# Patient Record
Sex: Female | Born: 1990 | Race: Black or African American | Hispanic: No | Marital: Single | Smoking: Never smoker
Health system: Southern US, Community
[De-identification: ages and names within clinical notes are randomized; demographics above are authoritative.]

## PROBLEM LIST (undated history)

## (undated) ENCOUNTER — Inpatient Hospital Stay (HOSPITAL_COMMUNITY): Payer: Self-pay

## (undated) DIAGNOSIS — R87629 Unspecified abnormal cytological findings in specimens from vagina: Secondary | ICD-10-CM

## (undated) DIAGNOSIS — K649 Unspecified hemorrhoids: Secondary | ICD-10-CM

## (undated) DIAGNOSIS — N83209 Unspecified ovarian cyst, unspecified side: Secondary | ICD-10-CM

## (undated) HISTORY — PX: CRYOTHERAPY: SHX1416

## (undated) HISTORY — PX: WISDOM TOOTH EXTRACTION: SHX21

---

## 2015-02-18 ENCOUNTER — Encounter (HOSPITAL_COMMUNITY): Payer: Self-pay | Admitting: Emergency Medicine

## 2015-02-18 DIAGNOSIS — Z3202 Encounter for pregnancy test, result negative: Secondary | ICD-10-CM | POA: Insufficient documentation

## 2015-02-18 DIAGNOSIS — R102 Pelvic and perineal pain: Secondary | ICD-10-CM | POA: Diagnosis present

## 2015-02-18 DIAGNOSIS — N8320 Unspecified ovarian cysts: Secondary | ICD-10-CM | POA: Insufficient documentation

## 2015-02-18 LAB — URINALYSIS, ROUTINE W REFLEX MICROSCOPIC
Bilirubin Urine: NEGATIVE
Glucose, UA: NEGATIVE mg/dL
Hgb urine dipstick: NEGATIVE
KETONES UR: NEGATIVE mg/dL
LEUKOCYTES UA: NEGATIVE
NITRITE: NEGATIVE
PROTEIN: NEGATIVE mg/dL
Specific Gravity, Urine: 1.021 (ref 1.005–1.030)
Urobilinogen, UA: 0.2 mg/dL (ref 0.0–1.0)
pH: 8 (ref 5.0–8.0)

## 2015-02-18 LAB — COMPREHENSIVE METABOLIC PANEL
ALT: 8 U/L — AB (ref 14–54)
AST: 14 U/L — ABNORMAL LOW (ref 15–41)
Albumin: 4 g/dL (ref 3.5–5.0)
Alkaline Phosphatase: 49 U/L (ref 38–126)
Anion gap: 7 (ref 5–15)
BUN: 7 mg/dL (ref 6–20)
CALCIUM: 8.9 mg/dL (ref 8.9–10.3)
CHLORIDE: 105 mmol/L (ref 101–111)
CO2: 23 mmol/L (ref 22–32)
CREATININE: 0.65 mg/dL (ref 0.44–1.00)
Glucose, Bld: 93 mg/dL (ref 65–99)
Potassium: 3.9 mmol/L (ref 3.5–5.1)
Sodium: 135 mmol/L (ref 135–145)
Total Bilirubin: 0.3 mg/dL (ref 0.3–1.2)
Total Protein: 6.9 g/dL (ref 6.5–8.1)

## 2015-02-18 LAB — CBC
HCT: 35.8 % — ABNORMAL LOW (ref 36.0–46.0)
Hemoglobin: 12.4 g/dL (ref 12.0–15.0)
MCH: 30.9 pg (ref 26.0–34.0)
MCHC: 34.6 g/dL (ref 30.0–36.0)
MCV: 89.3 fL (ref 78.0–100.0)
PLATELETS: 246 10*3/uL (ref 150–400)
RBC: 4.01 MIL/uL (ref 3.87–5.11)
RDW: 12.7 % (ref 11.5–15.5)
WBC: 5.7 10*3/uL (ref 4.0–10.5)

## 2015-02-18 LAB — LIPASE, BLOOD: LIPASE: 28 U/L (ref 22–51)

## 2015-02-18 NOTE — ED Notes (Signed)
Patient here with complaint of left lower abdominal/pelvic pain. States onset 1 week ago. Denies all other symptoms. Pain is dull and becomes sharp at times. Stopped Depo injections in July after having been taking them since age 24.

## 2015-02-19 ENCOUNTER — Emergency Department (HOSPITAL_COMMUNITY)
Admission: EM | Admit: 2015-02-19 | Discharge: 2015-02-19 | Disposition: A | Discharge status: Home / Self Care | Payer: Managed Care, Other (non HMO) | Attending: Emergency Medicine | Admitting: Emergency Medicine

## 2015-02-19 ENCOUNTER — Emergency Department (HOSPITAL_COMMUNITY): Payer: Managed Care, Other (non HMO)

## 2015-02-19 DIAGNOSIS — R1032 Left lower quadrant pain: Secondary | ICD-10-CM

## 2015-02-19 DIAGNOSIS — N83201 Unspecified ovarian cyst, right side: Secondary | ICD-10-CM

## 2015-02-19 LAB — PREGNANCY, URINE: Preg Test, Ur: NEGATIVE

## 2015-02-19 LAB — WET PREP, GENITAL
Clue Cells Wet Prep HPF POC: NONE SEEN
TRICH WET PREP: NONE SEEN
YEAST WET PREP: NONE SEEN

## 2015-02-19 LAB — GC/CHLAMYDIA PROBE AMP (~~LOC~~) NOT AT ARMC
CHLAMYDIA, DNA PROBE: NEGATIVE
Neisseria Gonorrhea: NEGATIVE

## 2015-02-19 LAB — HIV ANTIBODY (ROUTINE TESTING W REFLEX): HIV Screen 4th Generation wRfx: NONREACTIVE

## 2015-02-19 NOTE — ED Provider Notes (Signed)
CSN: [id number]     Arrival date & time 02/18/15  2035 History  This chart was scribed for Tomasita Crumble, MD by Evon Slack, ED Scribe. This patient was seen in room A10C/A10C and the patient's care was started at 1:25 AM.     Chief Complaint  Patient presents with  . Abdominal Pain   The history is provided by the patient. No language interpreter was used.   HPI Comments: Lisa Summers is a 24 y.o. female who presents to the Emergency Department complaining of lower pelvic pain onset 1 week prior. She states that the pain is intermittently worse and becomes stabbing. She states that the pain intermittently radiates to her back. She states that in certain position the pain radiates up her left side. She reports some left breast tenderness as well. She states that she has been taking ibuprofen with no relief. Pt states that she initially thought her menstraul cycle was starting due to coming off her depo injection. She states that her last depo injection was April 2016.  Pt denies dysuria, vaginal bleeding or other related symptoms.   History reviewed. No pertinent past medical history. Past Surgical History  Procedure Laterality Date  . Wisdom tooth extraction     No family history on file. Social History  Substance Use Topics  . Smoking status: Never Smoker   . Smokeless tobacco: None  . Alcohol Use: Yes     Comment: occasional   OB History    No data available      Review of Systems  Gastrointestinal: Positive for abdominal pain.  Genitourinary: Negative for dysuria and vaginal bleeding.  All other systems reviewed and are negative.    Allergies  Review of patient's allergies indicates no known allergies.  Home Medications   Prior to Admission medications   Medication Sig Start Date End Date Taking? Authorizing Provider  ibuprofen (ADVIL,MOTRIN) 200 MG tablet Take 600 mg by mouth every 6 (six) hours as needed for mild pain.   Yes Historical Provider, MD   naproxen sodium (ANAPROX) 220 MG tablet Take 440 mg by mouth daily as needed (general pain).   Yes Historical Provider, MD   BP 98/60 mmHg  Pulse 98  Temp(Src) 98.2 F (36.8 C) (Oral)  Resp 16  Ht  (1.626 m)  Wt 105 lb (47.628 kg)  BMI 18.01 kg/m2  SpO2 98%  LMP    Physical Exam  Constitutional: She is oriented to person, place, and time. She appears well-developed and well-nourished. No distress.  HENT:  Head: Normocephalic and atraumatic.  Nose: Nose normal.  Mouth/Throat: Oropharynx is clear and moist. No oropharyngeal exudate.  Eyes: Conjunctivae and EOM are normal. Pupils are equal, round, and reactive to light. No scleral icterus.  Neck: Normal range of motion. Neck supple. No JVD present. No tracheal deviation present. No thyromegaly present.  Cardiovascular: Normal rate, regular rhythm and normal heart sounds.  Exam reveals no gallop and no friction rub.   No murmur heard. Pulmonary/Chest: Effort normal and breath sounds normal. No respiratory distress. She has no wheezes. She exhibits no tenderness.  Abdominal: Soft. Bowel sounds are normal. She exhibits no distension and no mass. There is tenderness in the left lower quadrant. There is no rebound and no guarding.  Genitourinary: Vagina normal and uterus normal. No vaginal discharge found.  No CMT or adnexal tenderness  Musculoskeletal: Normal range of motion. She exhibits no edema or tenderness.  Lymphadenopathy:    She has no cervical adenopathy.  Neurological: She is alert and oriented to person, place, and time. No cranial nerve deficit. She exhibits normal muscle tone.  Skin: Skin is warm and dry. No rash noted. No erythema. No pallor.  Nursing note and vitals reviewed.   ED Course  Procedures (including critical care time) DIAGNOSTIC STUDIES: Oxygen Saturation is 98% on RA, normal by my interpretation.    COORDINATION OF CARE: 2:03 AM-Discussed treatment plan with pt at bedside and pt agreed to plan.      Labs Review Labs Reviewed  WET PREP, GENITAL - Abnormal; Notable for the following:    WBC, Wet Prep HPF POC FEW (*)    All other components within normal limits  COMPREHENSIVE METABOLIC PANEL - Abnormal; Notable for the following:    AST 14 (*)    ALT 8 (*)    All other components within normal limits  CBC - Abnormal; Notable for the following:    HCT 35.8 (*)    All other components within normal limits  URINALYSIS, ROUTINE W REFLEX MICROSCOPIC (NOT AT [facility]) - Abnormal; Notable for the following:    APPearance CLOUDY (*)    All other components within normal limits  LIPASE, BLOOD  PREGNANCY, URINE  HIV ANTIBODY (ROUTINE TESTING)  GC/CHLAMYDIA PROBE AMP ([place]) NOT AT [facility]    Imaging Review US Transvaginal Non-ob  02/19/2015   CLINICAL DATA:  Left lower quadrant pain for 1 week.  EXAM: TRANSABDOMINAL AND TRANSVAGINAL ULTRASOUND OF PELVIS  TECHNIQUE: Both transabdominal and transvaginal ultrasound examinations of the pelvis were performed. Transabdominal technique was performed for global imaging of the pelvis including uterus, ovaries, adnexal regions, and pelvic cul-de-sac. It was necessary to proceed with endovaginal exam following the transabdominal exam to visualize the right and left ovary.  COMPARISON:  None  FINDINGS: Uterus  Measurements: 5.6 x 3.6 x 4.9 cm. No fibroids or other mass visualized.  Endometrium  Thickness: 3 mm.  No focal abnormality visualized.  Right ovary  Measurements: 4.7 x 3.2 x 4.2 cm. There is a 3.4 x 2.5 x 3.0 cm simple cyst in the right ovary. Blood flow seen to the surrounding ovarian parenchyma.  Left ovary  Measurements: 3.6 x 1.9 x 2.9 cm. There are physiologic follicles. Normal appearance/no adnexal mass. Normal blood flow seen.  Other findings  No free fluid.  IMPRESSION: 1. Simple right ovarian cyst measuring 3.4 cm. Normal blood flow to the surrounding ovarian parenchyma. 2. Normal appearance of the uterus and left ovary.   Electronically  Signed   By: Rubye Oaks M.D.   On: 02/19/2015 04:55   US Pelvis Complete  02/19/2015   CLINICAL DATA:  Left lower quadrant pain for 1 week.  EXAM: TRANSABDOMINAL AND TRANSVAGINAL ULTRASOUND OF PELVIS  TECHNIQUE: Both transabdominal and transvaginal ultrasound examinations of the pelvis were performed. Transabdominal technique was performed for global imaging of the pelvis including uterus, ovaries, adnexal regions, and pelvic cul-de-sac. It was necessary to proceed with endovaginal exam following the transabdominal exam to visualize the right and left ovary.  COMPARISON:  None  FINDINGS: Uterus  Measurements: 5.6 x 3.6 x 4.9 cm. No fibroids or other mass visualized.  Endometrium  Thickness: 3 mm.  No focal abnormality visualized.  Right ovary  Measurements: 4.7 x 3.2 x 4.2 cm. There is a 3.4 x 2.5 x 3.0 cm simple cyst in the right ovary. Blood flow seen to the surrounding ovarian parenchyma.  Left ovary  Measurements: 3.6 x 1.9 x 2.9 cm. There are physiologic  follicles. Normal appearance/no adnexal mass. Normal blood flow seen.  Other findings  No free fluid.  IMPRESSION: 1. Simple right ovarian cyst measuring 3.4 cm. Normal blood flow to the surrounding ovarian parenchyma. 2. Normal appearance of the uterus and left ovary.   Electronically Signed   By: Rubye Oaks M.D.   On: 02/19/2015 04:55      EKG Interpretation None      MDM   Final diagnoses:  None   Patient presents to the ED for LLQ abdominal pain x 1 week.  Labs and pelvic exam are unremarkable.  Will obtain transvaginal ultrasound for eval of torsion vs. Fibroids.  She is not requesting any pain meds.  She appears quite well and smiling in the room  Pelvic exam is unremarkable. Ultrasound reveals a simple cyst on the right side. Her pain seems to be mostly on the left side although she does admit to bilateral lower quadrant pain. Ovarian cyst was described to the patient and her mother, she was explained that this may not be  causing all of her pain. OB/GYN follow-up was advised. Return precautions given for possible CT scan if symptoms persist. She otherwise appears well, she is sleeping comfortably in the room and in no acute distress. Her vital signs remain within her normal limits and she is safe for discharge.   I personally performed the services described in this documentation, which was scribed in my presence. The recorded information has been reviewed and is accurate.    Tomasita Crumble, MD 02/19/15 [phone]

## 2015-02-19 NOTE — Discharge Instructions (Signed)
Ovarian Cyst Ms. Morrill, your ultrasound shows an ovarian cyst on the right side. If the results are below. This may be causing your pain, use Tylenol or ibuprofen as needed. See an OB/GYN within 3 days for close follow-up. If your pain worsens or becomes more severe, complex emergency department immediately. Thank you.   IMPRESSION: 1. Simple right ovarian cyst measuring 3.4 cm. Normal blood flow to the surrounding ovarian parenchyma. 2. Normal appearance of the uterus and left ovary.  An ovarian cyst is a sac filled with fluid or blood. This sac is attached to the ovary. Some cysts go away on their own. Other cysts need treatment.  HOME CARE   Only take medicine as told by your doctor.  Follow up with your doctor as told.  Get regular pelvic exams and Pap tests. GET HELP IF:  Your periods are late, not regular, or painful.  You stop having periods.  Your belly (abdominal) or pelvic pain does not go away.  Your belly becomes large or puffy (swollen).  You have a hard time peeing (totally emptying your bladder).  You have pressure on your bladder.  You have pain during sex.  You feel fullness, pressure, or discomfort in your belly.  You lose weight for no reason.  You feel sick most of the time.  You have a hard time pooping (constipation).  You do not feel like eating.  You develop pimples (acne).  You have an increase in hair on your body and face.  You are gaining weight for no reason.  You think you are pregnant. GET HELP RIGHT AWAY IF:   Your belly pain gets worse.  You feel sick to your stomach (nauseous), and you throw up (vomit).  You have a fever that comes on fast.  You have belly pain while pooping (bowel movement).  Your periods are heavier than usual. MAKE SURE YOU:   Understand these instructions.  Will watch your condition.  Will get help right away if you are not doing well or get worse. Document Released: 11/11/2007 Document Revised:  03/15/2013 Document Reviewed: 01/30/2013 [facility] Patient Information 2015 [place], [place]. This information is not intended to replace advice given to you by your health care provider. Make sure you discuss any questions you have with your health care provider.

## 2016-07-17 ENCOUNTER — Other Ambulatory Visit (HOSPITAL_COMMUNITY): Payer: Self-pay | Admitting: Specialist

## 2016-07-17 DIAGNOSIS — Z8279 Family history of other congenital malformations, deformations and chromosomal abnormalities: Secondary | ICD-10-CM

## 2016-08-05 DIAGNOSIS — Z8759 Personal history of other complications of pregnancy, childbirth and the puerperium: Secondary | ICD-10-CM

## 2016-08-05 DIAGNOSIS — O4590 Premature separation of placenta, unspecified, unspecified trimester: Secondary | ICD-10-CM

## 2016-08-10 ENCOUNTER — Encounter (HOSPITAL_COMMUNITY): Payer: Self-pay | Admitting: Specialist

## 2016-08-18 ENCOUNTER — Encounter (HOSPITAL_COMMUNITY): Payer: Self-pay

## 2016-08-18 ENCOUNTER — Ambulatory Visit (HOSPITAL_COMMUNITY): Payer: Managed Care, Other (non HMO)

## 2017-01-25 ENCOUNTER — Ambulatory Visit: Payer: Managed Care, Other (non HMO)

## 2017-12-13 ENCOUNTER — Encounter: Payer: Managed Care, Other (non HMO) | Admitting: Family Medicine

## 2017-12-17 LAB — OB RESULTS CONSOLE HIV ANTIBODY (ROUTINE TESTING): HIV: NONREACTIVE

## 2017-12-29 ENCOUNTER — Other Ambulatory Visit (HOSPITAL_COMMUNITY): Payer: Self-pay | Admitting: Obstetrics and Gynecology

## 2017-12-29 DIAGNOSIS — O09299 Supervision of pregnancy with other poor reproductive or obstetric history, unspecified trimester: Secondary | ICD-10-CM

## 2018-01-05 ENCOUNTER — Encounter (HOSPITAL_COMMUNITY): Payer: Self-pay | Admitting: *Deleted

## 2018-01-09 ENCOUNTER — Inpatient Hospital Stay (HOSPITAL_COMMUNITY): Payer: 59

## 2018-01-09 ENCOUNTER — Encounter (HOSPITAL_COMMUNITY): Payer: Self-pay

## 2018-01-09 ENCOUNTER — Inpatient Hospital Stay (HOSPITAL_COMMUNITY)
Admission: AD | Admit: 2018-01-09 | Discharge: 2018-01-09 | Disposition: A | Discharge status: Home / Self Care | Payer: 59 | Source: Ambulatory Visit | Attending: Obstetrics & Gynecology | Admitting: Obstetrics & Gynecology

## 2018-01-09 DIAGNOSIS — O209 Hemorrhage in early pregnancy, unspecified: Secondary | ICD-10-CM

## 2018-01-09 DIAGNOSIS — Z3A12 12 weeks gestation of pregnancy: Secondary | ICD-10-CM | POA: Insufficient documentation

## 2018-01-09 NOTE — MAU Provider Note (Signed)
Chief Complaint: Vaginal Bleeding   None    SUBJECTIVE HPI: Lisa Summers is a 27 y.o. G2P0010 at 235w5d who presents to Maternity Admissions reporting leaking of bloody fluid and pelvic pressure.  Past hx of 17 week loss with SROM.  Denies smoking or drug use. US in office at 9.3 days normal US cervical length wnl  Location: uterine cramping and vaginal bleeding Quality: watery bloody fluid Severity: 5/10 on pain scale Duration: tonight Timing: comes and goes Modifying factors: has had no otc   Past Medical History:  Diagnosis Date  . Hemorrhoids   . Ovarian cyst   . Vaginal Pap smear, abnormal    OB History  Gravida Para Term Preterm AB Living  2       1 0  SAB TAB Ectopic Multiple Live Births  1            # Outcome Date GA Lbr Len/2nd Weight Sex Delivery Anes PTL Lv  2 Current           1 SAB 08/05/16 4416w0d            Complications: History of preterm premature rupture of membranes (PPROM), Antepartum placental abruption   Past Surgical History:  Procedure Laterality Date  . CRYOTHERAPY     of cervical lesion  . WISDOM TOOTH EXTRACTION     Social History   Socioeconomic History  . Marital status: Significant Other    Spouse name: Not on file  . Number of children: Not on file  . Years of education: Not on file  . Highest education level: Not on file  Occupational History  . Not on file  Social Needs  . Financial resource strain: Not on file  . Food insecurity:    Worry: Not on file    Inability: Not on file  . Transportation needs:    Medical: Not on file    Non-medical: Not on file  Tobacco Use  . Smoking status: Never Smoker  . Smokeless tobacco: Never Used  Substance and Sexual Activity  . Alcohol use: Yes    Comment: occasional  . Drug use: No  . Sexual activity: Not on file  Lifestyle  . Physical activity:    Days per week: Not on file    Minutes per session: Not on file  . Stress: Not on file  Relationships  . Social  connections:    Talks on phone: Not on file    Gets together: Not on file    Attends religious service: Not on file    Active member of club or organization: Not on file    Attends meetings of clubs or organizations: Not on file    Relationship status: Not on file  . Intimate partner violence:    Fear of current or ex partner: Not on file    Emotionally abused: Not on file    Physically abused: Not on file    Forced sexual activity: Not on file  Other Topics Concern  . Not on file  Social History Narrative  . Not on file   Family History  Problem Relation Age of Onset  . Asthma Mother   . Cancer Father   . Cancer Maternal Aunt   . Heart disease Maternal Grandmother   . Hypertension Maternal Grandmother   . Diabetes Maternal Grandmother    No current facility-administered medications on file prior to encounter.    Current Outpatient Medications on File Prior to Encounter  Medication Sig  Dispense Refill  . ibuprofen (ADVIL,MOTRIN) 200 MG tablet Take 600 mg by mouth every 6 (six) hours as needed for mild pain.    . naproxen sodium (ANAPROX) 220 MG tablet Take 440 mg by mouth daily as needed (general pain).     No Known Allergies  I have reviewed patient's Past Medical Hx, Surgical Hx, Family Hx, Social Hx, medications and allergies.   Review of Systems  Constitutional: Negative.   HENT: Negative.   Eyes: Negative.   Respiratory: Negative.   Cardiovascular: Negative.   Gastrointestinal: Positive for abdominal pain.  Endocrine: Negative.   Genitourinary: Positive for vaginal bleeding.  Musculoskeletal: Negative.   Skin: Negative.   Allergic/Immunologic: Negative.   Neurological: Negative.   Hematological: Negative.   Psychiatric/Behavioral: Negative.     OBJECTIVE Patient Vitals for the past 24 hrs:  BP Temp Pulse Resp  01/09/18 2121 113/68 98.4 F (36.9 C) 91 19  FHT 162 Constitutional: Well-developed, well-nourished female in no acute distress.   Cardiovascular: normal rate Respiratory: normal rate and effort.  GI: Abd soft, slight tenderness, gravid appropriate for gestational age.  MS: Extremities nontender, no edema, normal ROM Neurologic: Alert and oriented x 4.  GU: Neg CVAT.  SPECULUM EXAM: NEFG, physiologic discharge,bloody watery discharge noted, cervix clean and closed.   BIMANUAL: cervix LTC; uterus 12 weeksize, no adnexal tenderness or masses.  No CMT.  LAB RESULTS No results found for this or any previous visit (from the past 24 hour(s)).  IMAGING US Ob Comp Less 14 Wks  Result Date: 01/09/2018 CLINICAL DATA:  27 y/o  F; vaginal bleeding. EXAM: OBSTETRIC <14 WK ULTRASOUND TECHNIQUE: Transabdominal ultrasound was performed for evaluation of the gestation as well as the maternal uterus and adnexal regions. COMPARISON:  02/19/2015 pelvic ultrasound. FINDINGS: Intrauterine gestational sac: Single Yolk sac:  Not Visualized. Embryo:  Visualized. Cardiac Activity: Visualized. Heart Rate: 167 bpm CRL: 61.8 mm   12 w 4 d                  [place] EDC: 07/20/2018 Subchorionic hemorrhage:  None visualized. Maternal uterus/adnexae: Normal appearance of the uterus and bilateral adnexa. IMPRESSION: Single live intrauterine pregnancy with estimated gestational age of [redacted] weeks and 4 days. No acute process identified. Electronically Signed   By: Mitzi Hansen M.D.   On: 01/09/2018 22:42    MAU COURSE Orders Placed This Encounter  Procedures  . US OB Comp Less 14 Wks   No orders of the defined types were placed in this encounter.   MDM PE, pelvic and US done. ASSESSMENT 1. Vaginal bleeding in pregnancy, first trimester     PLAN Discharge home in stable condition.  MFM appt tomorrow bleeding precautions  PNV, Tylenol   Kenney Houseman, CNM 01/09/2018  10:57 PM

## 2018-01-09 NOTE — MAU Note (Signed)
States at 2035 she started having some light bleeding that has since then increased with a moderate amount of bleeding and watery discharge.  She reports intermittent cramps and states this feels similar to her last miscarriage at 17 weeks.  Has not taken anything for the pain.

## 2018-01-10 ENCOUNTER — Ambulatory Visit (HOSPITAL_COMMUNITY)
Admission: RE | Admit: 2018-01-10 | Discharge: 2018-01-10 | Disposition: A | Discharge status: Home / Self Care | Payer: 59 | Source: Ambulatory Visit | Attending: Obstetrics and Gynecology | Admitting: Obstetrics and Gynecology

## 2018-01-10 ENCOUNTER — Encounter (HOSPITAL_COMMUNITY): Payer: Self-pay

## 2018-01-10 ENCOUNTER — Ambulatory Visit (HOSPITAL_BASED_OUTPATIENT_CLINIC_OR_DEPARTMENT_OTHER)
Admission: RE | Admit: 2018-01-10 | Discharge: 2018-01-10 | Disposition: A | Discharge status: Home / Self Care | Payer: 59 | Source: Ambulatory Visit | Attending: Obstetrics and Gynecology | Admitting: Obstetrics and Gynecology

## 2018-01-10 DIAGNOSIS — Z3A12 12 weeks gestation of pregnancy: Secondary | ICD-10-CM | POA: Diagnosis not present

## 2018-01-10 DIAGNOSIS — O209 Hemorrhage in early pregnancy, unspecified: Secondary | ICD-10-CM | POA: Insufficient documentation

## 2018-01-10 DIAGNOSIS — O09291 Supervision of pregnancy with other poor reproductive or obstetric history, first trimester: Secondary | ICD-10-CM | POA: Diagnosis not present

## 2018-01-10 DIAGNOSIS — O09299 Supervision of pregnancy with other poor reproductive or obstetric history, unspecified trimester: Secondary | ICD-10-CM

## 2018-01-10 HISTORY — DX: Unspecified hemorrhoids: K64.9

## 2018-01-10 HISTORY — DX: Unspecified abnormal cytological findings in specimens from vagina: R87.629

## 2018-01-10 HISTORY — DX: Unspecified ovarian cyst, unspecified side: N83.209

## 2018-01-10 NOTE — ED Notes (Signed)
Pt reports bleeding and cramping since last night.  Pt was evaluated in MAU.  Small amt of bright red bleeding this am.

## 2018-01-10 NOTE — Consult Note (Signed)
CONSULT NOTE      MATERNAL FETAL MEDICINE CONSULT  Patient Name: Lisa Summers  Medical Record Number: [id number] Date of Birth: 07/23/1990  Requesting Physician Name: Lisa Summers, CNM. Date of Service: 01/10/18  I had the pleasure of seeing [name] today at [facility] fetal [facility].  She was accompanied by her parents.  Obstetric history is significant in 2018 of a 17-week pregnancy loss.  Patient had first trimester bleeding and she reports she stopped bleeding from [redacted] weeks gestation.  At 17 weeks, she had abdominal cramping and was [place] in a [place].  Patient reports she was sent home without a pelvic examination.  Fetal heart activity was present on Doppler (no ultrasound was performed).  On the following day she had leakage of amniotic fluid and on evaluation the cervix was closed and ultrasound showed good fetal heart activity.  Oligohydramnios was seen.  Patient was counseled and later had augmentation and she delivered a female infant weighing 158 mg at birth.  No gross congenital anomalies seen.  No information on fetal karyotype or pathology is available.  July 2018 she reports she had HSG that was reported as normal with no intracavitary lesions.   GYN history: History of abnormal Pap smears about 8 years ago.  She had cryosurgery treatment.  No history of cold knife cone biopsy procedure.  Medical history: No history of diabetes or hypertension or any chronic medical conditions.  Surgical history: Wisdom tooth removal.  Medications: Prenatal vitamins, Zantac, Prilosec (not started yet).  Allergies: No known drug allergies.  Social: Denies tobacco drug or alcohol use.  Her partner is an African-American he has a pacemaker and had cardiac valve replaced he is 27 years old.  Family: Father died from intra-abdominal cancer.  Patient had an ultrasound today morning because of spotting and leakage of amniotic fluid.  Ultrasound report showed CRL  measurement consistent with 12 weeks 5 days gestation with good fetal heart activity.  Amniotic fluid was normal.  The EDD was amended to 07/19/2018 based on today's ultrasound finding. I reassured the patient of normal amniotic fluid by showing her ultrasound images.   I counseled the patient on the following History of midtrimester pregnancy loss: Based on history, the likelihood of cervical incompetence is very low.  Patient had vaginal bleeding early in her pregnancy and had leakage of amniotic fluid.  On the day before she delivered the cervix was closed on pelvic examination.  I reassured her that her history and examination is not consistent with cervical incompetence.  I do not recommend prophylactic cerclage.  However, I recommend cervical length measurements at 16, 18 and [redacted] weeks gestation.   Recommendations: -Cervical length measurements at 16, 18 and [redacted] weeks gestation.  Thank you for your consult.  If you have any questions or concerns please do not hesitate to contact me at the Center for maternal fetal care.  Consultation including face-to-face counseling 40 minutes.

## 2018-01-27 ENCOUNTER — Encounter (HOSPITAL_COMMUNITY): Payer: Self-pay

## 2018-01-27 ENCOUNTER — Inpatient Hospital Stay (HOSPITAL_COMMUNITY)
Admission: AD | Admit: 2018-01-27 | Discharge: 2018-01-27 | Disposition: A | Discharge status: Home / Self Care | Payer: 59 | Source: Ambulatory Visit | Attending: Obstetrics & Gynecology | Admitting: Obstetrics & Gynecology

## 2018-01-27 ENCOUNTER — Other Ambulatory Visit: Payer: Self-pay

## 2018-01-27 DIAGNOSIS — Z3A15 15 weeks gestation of pregnancy: Secondary | ICD-10-CM | POA: Diagnosis not present

## 2018-01-27 DIAGNOSIS — O209 Hemorrhage in early pregnancy, unspecified: Secondary | ICD-10-CM | POA: Insufficient documentation

## 2018-01-27 DIAGNOSIS — O4692 Antepartum hemorrhage, unspecified, second trimester: Secondary | ICD-10-CM

## 2018-01-27 NOTE — MAU Note (Signed)
Started having some bright red bleeding last night, has gotten heavier.  'fresh blood'.  Was seen here earlier in preg "old blood in her vault". No known cause.  Is having some cramping.  Has not soaked a pad

## 2018-01-27 NOTE — MAU Note (Signed)
Urine in lab 

## 2018-01-27 NOTE — MAU Provider Note (Signed)
Chief Complaint: Vaginal Bleeding and Abdominal Pain   None    SUBJECTIVE HPI: Lisa Summers is a 27 y.o. G2P0010 at 8674w2d who presents to Maternity Admissions reporting bright red bleeding and passing some small clots.  Has been seen by MFM and is getting CL every two weeks to watch for cervical incompetence and need of a cerclage.  Pt last CL was 4.2 in office.  Is having one on Monday.  Has hx of 17 week loss.   Location: vaginal bleeding Quality: small Severity: 0/10 on pain scale Duration: today   Past Medical History:  Diagnosis Date  . Hemorrhoids   . Ovarian cyst   . Vaginal Pap smear, abnormal    OB History  Gravida Para Term Preterm AB Living  2       1 0  SAB TAB Ectopic Multiple Live Births  1            # Outcome Date GA Lbr Len/2nd Weight Sex Delivery Anes PTL Lv  2 Current           1 SAB 08/05/16 3192w0d            Complications: History of preterm premature rupture of membranes (PPROM), Antepartum placental abruption   Past Surgical History:  Procedure Laterality Date  . CRYOTHERAPY     of cervical lesion  . WISDOM TOOTH EXTRACTION     Social History   Socioeconomic History  . Marital status: Single    Spouse name: Not on file  . Number of children: Not on file  . Years of education: Not on file  . Highest education level: Not on file  Occupational History  . Not on file  Social Needs  . Financial resource strain: Not on file  . Food insecurity:    Worry: Not on file    Inability: Not on file  . Transportation needs:    Medical: Not on file    Non-medical: Not on file  Tobacco Use  . Smoking status: Never Smoker  . Smokeless tobacco: Never Used  Substance and Sexual Activity  . Alcohol use: Not Currently    Comment: occasional  . Drug use: No  . Sexual activity: Not on file  Lifestyle  . Physical activity:    Days per week: Not on file    Minutes per session: Not on file  . Stress: Not on file  Relationships  . Social  connections:    Talks on phone: Not on file    Gets together: Not on file    Attends religious service: Not on file    Active member of club or organization: Not on file    Attends meetings of clubs or organizations: Not on file    Relationship status: Not on file  . Intimate partner violence:    Fear of current or ex partner: Not on file    Emotionally abused: Not on file    Physically abused: Not on file    Forced sexual activity: Not on file  Other Topics Concern  . Not on file  Social History Narrative  . Not on file   Family History  Problem Relation Age of Onset  . Asthma Mother   . Cancer Father   . Cancer Maternal Aunt   . Heart disease Maternal Grandmother   . Hypertension Maternal Grandmother   . Diabetes Maternal Grandmother    No current facility-administered medications on file prior to encounter.    Current Outpatient Medications  on File Prior to Encounter  Medication Sig Dispense Refill  . bisacodyl (DULCOLAX) 5 MG EC tablet Take 5 mg by mouth daily as needed for moderate constipation (Pt reports taking it as a suppository).    . pantoprazole (PROTONIX) 20 MG tablet Take 20 mg by mouth daily.    . Prenatal Vit-Fe Fumarate-FA (PRENATAL MULTIVITAMIN) TABS tablet Take 1 tablet by mouth daily at 12 noon.    . ranitidine (ZANTAC) 150 MG tablet Take 150 mg by mouth 2 (two) times daily.     No Known Allergies  I have reviewed patient's Past Medical Hx, Surgical Hx, Family Hx, Social Hx, medications and allergies.   Review of Systems  Constitutional: Negative.   HENT: Negative.   Eyes: Negative.   Respiratory: Negative.   Cardiovascular: Negative.   Gastrointestinal: Negative.   Endocrine: Negative.   Genitourinary: Positive for vaginal bleeding.  Musculoskeletal: Negative.   Skin: Negative.   Allergic/Immunologic: Negative.   Neurological: Negative.   Hematological: Negative.   Psychiatric/Behavioral: Negative.     OBJECTIVE Patient Vitals for the past  24 hrs:  BP Temp Temp src Pulse Resp SpO2 Weight  01/27/18 1012 105/61 98.7 F (37.1 C) Oral (!) 109 20 100 % 51.6 kg   Constitutional: Well-developed, well-nourished female in no acute distress.  Cardiovascular: normal rate Respiratory: normal rate and effort.  GI: Abd soft, non-tender, gravid appropriate for gestational age.  MS: Extremities nontender, no edema, normal ROM Neurologic: Alert and oriented x 4.  GU: Neg CVAT.  SPECULUM EXAM: NEFG, physiologic discharge,  Small amount brown and red blood noted, cervix closed  Small clot in os.  BIMANUAL: cervix closed small length feels >2.5 cm; uterus15 week size, no adnexal tenderness or masses.  No CMT. FHT 152 LAB RESULTS   IMAGING US Ob Comp Less 14 Wks  Result Date: 01/09/2018 CLINICAL DATA:  27 y/o  F; vaginal bleeding. EXAM: OBSTETRIC <14 WK ULTRASOUND TECHNIQUE: Transabdominal ultrasound was performed for evaluation of the gestation as well as the maternal uterus and adnexal regions. COMPARISON:  02/19/2015 pelvic ultrasound. FINDINGS: Intrauterine gestational sac: Single Yolk sac:  Not Visualized. Embryo:  Visualized. Cardiac Activity: Visualized. Heart Rate: 167 bpm CRL: 61.8 mm   12 w 4 d                  [place] EDC: 07/20/2018 Subchorionic hemorrhage:  None visualized. Maternal uterus/adnexae: Normal appearance of the uterus and bilateral adnexa. IMPRESSION: Single live intrauterine pregnancy with estimated gestational age of [redacted] weeks and 4 days. No acute process identified. Electronically Signed   By: Mitzi Hansen M.D.   On: 01/09/2018 22:42    MAU COURSE    MDM PE and speculum.  FHT 152, Discussed with Dr. Sallye Ober.  Pt will be discharged to follow up on Monday for CL in office. ASSESSMENT Bleeding in pregnancy 2nd trimester  PLAN Discharge home in stable condition. Bleeding  precautions     Kenney Houseman, CNM 01/27/2018  11:48 AM

## 2018-06-08 NOTE — L&D Delivery Note (Signed)
Delivery Note At 7:53 AM a viable female was delivered via Vaginal, Spontaneous (Presentation: LOA).  APGAR: 9, 9; weight pending.   Placenta status: delivered spontaneously and completely.  Cord: three vessel with the following complications: n/a.   Anesthesia:  Epidurak Episiotomy: None Lacerations: 1st degree, bilateral hemostatic labial tears Suture Repair: 4.0 vicryl interrupted Est. Blood Loss (mL):  QBL pending, estimate   Mom to postpartum.  Baby to Couplet care / Skin to Skin.  Janeece Riggers 07/10/2018, 8:14 AM

## 2018-06-21 LAB — OB RESULTS CONSOLE GBS: GBS: NEGATIVE

## 2018-06-22 LAB — OB RESULTS CONSOLE GC/CHLAMYDIA
Chlamydia: NEGATIVE
Gonorrhea: NEGATIVE

## 2018-07-10 ENCOUNTER — Inpatient Hospital Stay (HOSPITAL_COMMUNITY): Payer: 59 | Admitting: Anesthesiology

## 2018-07-10 ENCOUNTER — Encounter (HOSPITAL_COMMUNITY): Payer: Self-pay | Admitting: Emergency Medicine

## 2018-07-10 ENCOUNTER — Other Ambulatory Visit: Payer: Self-pay

## 2018-07-10 ENCOUNTER — Inpatient Hospital Stay (HOSPITAL_COMMUNITY)
Admission: AD | Admit: 2018-07-10 | Discharge: 2018-07-12 | DRG: 807 | Disposition: A | Discharge status: Home / Self Care | Payer: 59 | Attending: Obstetrics & Gynecology | Admitting: Obstetrics & Gynecology

## 2018-07-10 DIAGNOSIS — Z3A38 38 weeks gestation of pregnancy: Secondary | ICD-10-CM | POA: Diagnosis not present

## 2018-07-10 DIAGNOSIS — O4292 Full-term premature rupture of membranes, unspecified as to length of time between rupture and onset of labor: Secondary | ICD-10-CM | POA: Diagnosis present

## 2018-07-10 DIAGNOSIS — D649 Anemia, unspecified: Secondary | ICD-10-CM | POA: Diagnosis present

## 2018-07-10 DIAGNOSIS — O9902 Anemia complicating childbirth: Secondary | ICD-10-CM | POA: Diagnosis present

## 2018-07-10 DIAGNOSIS — Z3483 Encounter for supervision of other normal pregnancy, third trimester: Secondary | ICD-10-CM | POA: Diagnosis present

## 2018-07-10 LAB — CBC
HCT: 31.5 % — ABNORMAL LOW (ref 36.0–46.0)
Hemoglobin: 9.7 g/dL — ABNORMAL LOW (ref 12.0–15.0)
MCH: 26.2 pg (ref 26.0–34.0)
MCHC: 30.8 g/dL (ref 30.0–36.0)
MCV: 85.1 fL (ref 80.0–100.0)
Platelets: 294 10*3/uL (ref 150–400)
RBC: 3.7 MIL/uL — ABNORMAL LOW (ref 3.87–5.11)
RDW: 15.8 % — AB (ref 11.5–15.5)
WBC: 7 10*3/uL (ref 4.0–10.5)
nRBC: 1.4 % — ABNORMAL HIGH (ref 0.0–0.2)

## 2018-07-10 LAB — ABO/RH: ABO/RH(D): O POS

## 2018-07-10 LAB — TYPE AND SCREEN
ABO/RH(D): O POS
Antibody Screen: NEGATIVE

## 2018-07-10 LAB — RPR: RPR Ser Ql: NONREACTIVE

## 2018-07-10 MED ORDER — BENZOCAINE-MENTHOL 20-0.5 % EX AERO
1.0000 "application " | INHALATION_SPRAY | CUTANEOUS | Status: DC | PRN
  Administered 2018-07-10: 1 via TOPICAL
  Filled 2018-07-10: qty 56

## 2018-07-10 MED ORDER — IBUPROFEN 600 MG PO TABS
600.0000 mg | ORAL_TABLET | Freq: Four times a day (QID) | ORAL | Status: DC
  Administered 2018-07-10 – 2018-07-12 (×8): 600 mg via ORAL
  Filled 2018-07-10 (×8): qty 1

## 2018-07-10 MED ORDER — DIPHENHYDRAMINE HCL 50 MG/ML IJ SOLN: 12.5000 mg | INTRAMUSCULAR | Status: DC | PRN

## 2018-07-10 MED ORDER — SENNOSIDES-DOCUSATE SODIUM 8.6-50 MG PO TABS
2.0000 | ORAL_TABLET | ORAL | Status: DC
  Administered 2018-07-10 – 2018-07-11 (×2): 2 via ORAL
  Filled 2018-07-10 (×2): qty 2

## 2018-07-10 MED ORDER — COCONUT OIL OIL
1.0000 "application " | TOPICAL_OIL | Status: DC | PRN
  Administered 2018-07-11: 1 via TOPICAL
  Filled 2018-07-10: qty 120

## 2018-07-10 MED ORDER — OXYCODONE-ACETAMINOPHEN 5-325 MG PO TABS: 1.0000 | ORAL_TABLET | ORAL | Status: DC | PRN

## 2018-07-10 MED ORDER — TETANUS-DIPHTH-ACELL PERTUSSIS 5-2.5-18.5 LF-MCG/0.5 IM SUSP: 0.5000 mL | Freq: Once | INTRAMUSCULAR | Status: DC

## 2018-07-10 MED ORDER — OXYTOCIN BOLUS FROM INFUSION
500.0000 mL | Freq: Once | INTRAVENOUS | Status: AC
  Administered 2018-07-10: 500 mL via INTRAVENOUS

## 2018-07-10 MED ORDER — OXYTOCIN 40 UNITS IN NORMAL SALINE INFUSION - SIMPLE MED
2.5000 [IU]/h | INTRAVENOUS | Status: DC
  Filled 2018-07-10: qty 1000

## 2018-07-10 MED ORDER — ONDANSETRON HCL 4 MG PO TABS: 4.0000 mg | ORAL_TABLET | ORAL | Status: DC | PRN

## 2018-07-10 MED ORDER — ZOLPIDEM TARTRATE 5 MG PO TABS: 5.0000 mg | ORAL_TABLET | Freq: Every evening | ORAL | Status: DC | PRN

## 2018-07-10 MED ORDER — PHENYLEPHRINE 40 MCG/ML (10ML) SYRINGE FOR IV PUSH (FOR BLOOD PRESSURE SUPPORT)
80.0000 ug | PREFILLED_SYRINGE | INTRAVENOUS | Status: DC | PRN
  Filled 2018-07-10: qty 10

## 2018-07-10 MED ORDER — ONDANSETRON HCL 4 MG/2ML IJ SOLN: 4.0000 mg | Freq: Four times a day (QID) | INTRAMUSCULAR | Status: DC | PRN

## 2018-07-10 MED ORDER — PRENATAL MULTIVITAMIN CH
1.0000 | ORAL_TABLET | Freq: Every day | ORAL | Status: DC
  Administered 2018-07-10 – 2018-07-11 (×2): 1 via ORAL
  Filled 2018-07-10 (×2): qty 1

## 2018-07-10 MED ORDER — ONDANSETRON HCL 4 MG/2ML IJ SOLN: 4.0000 mg | INTRAMUSCULAR | Status: DC | PRN

## 2018-07-10 MED ORDER — FLEET ENEMA 7-19 GM/118ML RE ENEM: 1.0000 | ENEMA | RECTAL | Status: DC | PRN

## 2018-07-10 MED ORDER — EPHEDRINE 5 MG/ML INJ
10.0000 mg | INTRAVENOUS | Status: DC | PRN
  Filled 2018-07-10: qty 2

## 2018-07-10 MED ORDER — ACETAMINOPHEN 325 MG PO TABS
650.0000 mg | ORAL_TABLET | ORAL | Status: DC | PRN
  Administered 2018-07-11 – 2018-07-12 (×2): 650 mg via ORAL
  Filled 2018-07-10 (×2): qty 2

## 2018-07-10 MED ORDER — WITCH HAZEL-GLYCERIN EX PADS
1.0000 "application " | MEDICATED_PAD | CUTANEOUS | Status: DC | PRN
  Administered 2018-07-11: 1 via TOPICAL

## 2018-07-10 MED ORDER — LACTATED RINGERS IV SOLN
500.0000 mL | Freq: Once | INTRAVENOUS | Status: AC
  Administered 2018-07-10: 500 mL via INTRAVENOUS

## 2018-07-10 MED ORDER — DIBUCAINE 1 % RE OINT
1.0000 "application " | TOPICAL_OINTMENT | RECTAL | Status: DC | PRN
  Administered 2018-07-11: 1 via RECTAL
  Filled 2018-07-10: qty 28

## 2018-07-10 MED ORDER — PANTOPRAZOLE SODIUM 20 MG PO TBEC
20.0000 mg | DELAYED_RELEASE_TABLET | Freq: Every day | ORAL | Status: DC
  Filled 2018-07-10 (×3): qty 1

## 2018-07-10 MED ORDER — PHENYLEPHRINE 40 MCG/ML (10ML) SYRINGE FOR IV PUSH (FOR BLOOD PRESSURE SUPPORT)
80.0000 ug | PREFILLED_SYRINGE | INTRAVENOUS | Status: DC | PRN
  Filled 2018-07-10 (×2): qty 10

## 2018-07-10 MED ORDER — FENTANYL 2.5 MCG/ML BUPIVACAINE 1/10 % EPIDURAL INFUSION (WH - ANES)
14.0000 mL/h | INTRAMUSCULAR | Status: DC | PRN
  Administered 2018-07-10: 14 mL/h via EPIDURAL
  Filled 2018-07-10 (×2): qty 100

## 2018-07-10 MED ORDER — DIPHENHYDRAMINE HCL 25 MG PO CAPS: 25.0000 mg | ORAL_CAPSULE | Freq: Four times a day (QID) | ORAL | Status: DC | PRN

## 2018-07-10 MED ORDER — LACTATED RINGERS IV SOLN
INTRAVENOUS | Status: DC
  Administered 2018-07-10 (×2): via INTRAVENOUS

## 2018-07-10 MED ORDER — OXYCODONE-ACETAMINOPHEN 5-325 MG PO TABS: 2.0000 | ORAL_TABLET | ORAL | Status: DC | PRN

## 2018-07-10 MED ORDER — LIDOCAINE HCL (PF) 1 % IJ SOLN
INTRAMUSCULAR | Status: DC | PRN
  Administered 2018-07-10: 5 mL via EPIDURAL
  Administered 2018-07-10: 6 mL via EPIDURAL

## 2018-07-10 MED ORDER — ACETAMINOPHEN 325 MG PO TABS: 650.0000 mg | ORAL_TABLET | ORAL | Status: DC | PRN

## 2018-07-10 MED ORDER — LIDOCAINE HCL (PF) 1 % IJ SOLN
30.0000 mL | INTRAMUSCULAR | Status: DC | PRN
  Filled 2018-07-10: qty 30

## 2018-07-10 MED ORDER — SIMETHICONE 80 MG PO CHEW: 80.0000 mg | CHEWABLE_TABLET | ORAL | Status: DC | PRN

## 2018-07-10 MED ORDER — LACTATED RINGERS IV SOLN
500.0000 mL | INTRAVENOUS | Status: DC | PRN
  Administered 2018-07-10: 500 mL via INTRAVENOUS

## 2018-07-10 MED ORDER — SOD CITRATE-CITRIC ACID 500-334 MG/5ML PO SOLN: 30.0000 mL | ORAL | Status: DC | PRN

## 2018-07-10 MED ORDER — VITAMIN K1 1 MG/0.5ML IJ SOLN
INTRAMUSCULAR | Status: AC
  Filled 2018-07-10: qty 0.5

## 2018-07-10 NOTE — Lactation Note (Signed)
This note was copied from a baby's chart. Lactation Consultation Note  Patient Name: Lisa Summers Date: 07/10/2018 Reason for consult: Initial assessment;1st time breastfeeding;Early term 41-38.6wks  4 hours old early term female who is being exclusively BF by his mother, she's a P2 but had a miscarriage at 31 weeks; this is her first live birth. LC showed mom how to hand express but no colostrum was observed yet.  Mom's breast are dense but her tissue is still compressible. However mom reported positive breast changes during this pregnancy. She has a [employer] DEBP at home that she brought to the hospital.  Mom was swaddling baby when entering the room, he was cueing. Offered assistance with latch and she agreed to have him STS. LC took baby to mom's left breast and he was able to latch with very little help, right away, mom reported some discomfort at first, repositioned baby, showed mom how to break the latch. But once baby got a good latch he fell asleep shortly after, reviewed with mom the key point for a deep latch, baby had a big wide open mouth before falling asleep. Mom was very receptive to learning and had some questions. Discussed normal newborn behavior, cluster feeding, benefits of pumping early on and baby's sleeping cycle.  Feeding plan  1. Encouraged mom to feed baby STS 8-12 times/24 hours or sooner if feeding cues are present 2. Hand expression and spoon feeding were also encouraged  BF brochure, BF resources and feeding diary were reviewed. Parents reported all questions and concerns were answered, they're both aware of LC services and will call PRN.  Maternal Data Formula Feeding for Exclusion: No Has patient been taught Hand Expression?: Yes Does the patient have breastfeeding experience prior to this delivery?: No(her first baby was born at 21 weeks and didn't survive)  Feeding Feeding Type: Breast Fed  LATCH Score Latch: Grasps breast easily, tongue down,  lips flanged, rhythmical sucking.  Audible Swallowing: None  Type of Nipple: Everted at rest and after stimulation  Comfort (Breast/Nipple): Soft / non-tender  Hold (Positioning): Assistance needed to correctly position infant at breast and maintain latch.  LATCH Score: 7  Interventions Interventions: Breast feeding basics reviewed;Assisted with latch;Skin to skin;Breast massage;Hand express;Breast compression;Adjust position;Support pillows  Lactation Tools Discussed/Used WIC Program: No   Consult Status Consult Status: Follow-up Date: 07/11/18 Follow-up type: In-patient    Shaton Lore Venetia Constable 07/10/2018, 12:27 PM

## 2018-07-10 NOTE — Anesthesia Preprocedure Evaluation (Signed)
Anesthesia Evaluation  Patient identified by MRN, date of birth, ID band Patient awake    Reviewed: Allergy & Precautions, H&P , NPO status , Patient's Chart, lab work & pertinent test results  Airway Mallampati: I  TM Distance: >3 FB Neck ROM: full    Dental no notable dental hx. (+) Teeth Intact   Pulmonary neg pulmonary ROS,    Pulmonary exam normal breath sounds clear to auscultation       Cardiovascular negative cardio ROS Normal cardiovascular exam Rhythm:regular Rate:Normal     Neuro/Psych negative neurological ROS  negative psych ROS   GI/Hepatic negative GI ROS, Neg liver ROS,   Endo/Other  negative endocrine ROS  Renal/GU negative Renal ROS  negative genitourinary   Musculoskeletal negative musculoskeletal ROS (+)   Abdominal Normal abdominal exam  (+)   Peds  Hematology  (+) Blood dyscrasia, anemia ,   Anesthesia Other Findings   Reproductive/Obstetrics (+) Pregnancy                             Anesthesia Physical Anesthesia Plan  ASA: II  Anesthesia Plan: Epidural   Post-op Pain Management:    Induction:   PONV Risk Score and Plan:   Airway Management Planned:   Additional Equipment:   Intra-op Plan:   Post-operative Plan:   Informed Consent: I have reviewed the patients History and Physical, chart, labs and discussed the procedure including the risks, benefits and alternatives for the proposed anesthesia with the patient or authorized representative who has indicated his/her understanding and acceptance.       Plan Discussed with:   Anesthesia Plan Comments:         Anesthesia Quick Evaluation  

## 2018-07-10 NOTE — Anesthesia Postprocedure Evaluation (Signed)
Anesthesia Post Note  Patient: Lisa Summers  Procedure(s) Performed: AN AD HOC LABOR EPIDURAL     Patient location during evaluation: Mother Baby Anesthesia Type: Epidural Level of consciousness: awake and alert Pain management: pain level controlled Vital Signs Assessment: post-procedure vital signs reviewed and stable Respiratory status: spontaneous breathing, nonlabored ventilation and respiratory function stable Cardiovascular status: stable Postop Assessment: no headache, no backache, epidural receding, patient able to bend at knees and able to ambulate Anesthetic complications: no    Last Vitals:  Vitals:   07/10/18 1000 07/10/18 1545  BP: 124/79 118/68  Pulse: 74 83  Resp: 18 20  Temp: 37.1 C 36.9 C  SpO2: 99% 98%    Last Pain:  Vitals:   07/10/18 1545  TempSrc: Oral  PainSc: 0-No pain   Pain Goal:                   Rica Records

## 2018-07-10 NOTE — Anesthesia Procedure Notes (Addendum)
Epidural Patient location during procedure: OB Start time: 07/10/2018 2:48 AM End time: 07/10/2018 2:52 AM  Staffing Anesthesiologist: Leilani Able, MD Performed: anesthesiologist   Preanesthetic Checklist Completed: patient identified, site marked, surgical consent, pre-op evaluation, timeout performed, IV checked, risks and benefits discussed and monitors and equipment checked  Epidural Patient position: sitting Prep: site prepped and draped and DuraPrep Patient monitoring: continuous pulse ox and blood pressure Approach: midline Location: L3-L4 Injection technique: LOR air  Needle:  Needle type: Tuohy  Needle gauge: 17 G Needle length: 9 cm and 9 Needle insertion depth: 5 cm cm Catheter type: closed end flexible Catheter size: 19 Gauge Catheter at skin depth: 10 cm Test dose: negative and Other  Assessment Sensory level: T9 Events: blood not aspirated, injection not painful, no injection resistance, negative IV test and no paresthesia  Additional Notes Reason for block:procedure for pain

## 2018-07-10 NOTE — H&P (Addendum)
Lisa Summers is a 28 y.o. female presenting for SROM around 11pm, clear fluid. No regular contractions, but she was worried about complications after her 17wk loss and preferred to come for eval/admission. +ferning noted by MAU provider and she was admitted. Has been feeling contractions since we spoke shortly after SROM. She is feeling increased pain with contractions. Is able to breathe through them.  OB History    Gravida  2   Para      Term      Preterm      AB  1   Living  0     SAB  1   TAB      Ectopic      Multiple      Live Births             Past Medical History:  Diagnosis Date  . Hemorrhoids   . Ovarian cyst   . Vaginal Pap smear, abnormal    Past Surgical History:  Procedure Laterality Date  . CRYOTHERAPY     of cervical lesion  . WISDOM TOOTH EXTRACTION     Family History: family history includes Asthma in her mother; Cancer in her father and maternal aunt; Diabetes in her maternal grandmother; Heart disease in her maternal grandmother; Hypertension in her maternal grandmother. Social History:  reports that she has never smoked. She has never used smokeless tobacco. She reports previous alcohol use. She reports that she does not use drugs.     Maternal Diabetes: No Genetic Screening: Normal Maternal Ultrasounds/Referrals: Normal Fetal Ultrasounds or other Referrals:  Fetal echo for FOB with congenital cardiac anomaly - WNL Maternal Substance Abuse:  No Significant Maternal Medications:  None Significant Maternal Lab Results:  None Other Comments:  None  Review of Systems  HENT: Negative.   Gastrointestinal: Negative.   Genitourinary: Negative.   All other systems reviewed and are negative.  Maternal Medical History:  Reason for admission: Rupture of membranes.   Contractions: Onset was 3-5 hours ago.   Frequency: regular.   Perceived severity is strong.    Fetal activity: Perceived fetal activity is normal.   Last perceived  fetal movement was within the past hour.    Prenatal complications: no prenatal complications   Dilation: 1 Effacement (%): 90 Station: -2 Exam by:: Everlene FarrierHilary Hardy, RN  Blood pressure 119/77, pulse 89, temperature 98.5 F (36.9 C), temperature source Oral, resp. rate 16, height 5\' 4"  (1.626 m), weight 69.3 kg, last menstrual period 10/04/2017, SpO2 100 %. Maternal Exam:  Uterine Assessment: Contraction strength is moderate.  Contraction frequency is regular.   Abdomen: Patient reports no abdominal tenderness. Estimated fetal weight is 6.   Fetal presentation: vertex  Introitus: not evaluated.     Physical Exam  Nursing note and vitals reviewed. Constitutional: She is oriented to person, place, and time. She appears well-developed and well-nourished.  HENT:  Head: Normocephalic and atraumatic.  Cardiovascular: Normal rate.  Respiratory: Effort normal.  GI: Soft. There is no abdominal tenderness.  Neurological: She is alert and oriented to person, place, and time.  Skin: Skin is warm and dry.  Psychiatric: She has a normal mood and affect. Her behavior is normal.    Prenatal labs: ABO, Rh: --/--/O POS (02/02 0100) Antibody: NEG (02/02 0100) Rubella:  immune RPR:   neg HBsAg:   neg HIV: Non-reactive (07/12 0000)  GBS:   negative 06/21/2018  Assessment/Plan: Admit to L&D with routine [facility] orders. FHR cat I. She is requesting  epidural and will do that. Cervical exam deferred. We talked about other options for comfort. She is coping and breathing well, but says she is having too much back pain. Offered nitrous or IV medication. She prefers epidural and I think that is reasonable.  Augment with pitocin prn - will discuss risks/benefits if indicated.  Anticipate SVD Dr Mora Appl aware of admission for PROM, term, uncomplicated pregnancy. Consult prn.  Faylene Million Oryan Winterton 07/10/2018, 2:45 AM

## 2018-07-10 NOTE — Progress Notes (Signed)
Comfortable s/p epidural.  Ctx every .  VE with consent 4/100/-2 - lots of room in pelvis.  Discussed risks/benefits of pitocin if ctx do not increase over the next 30-27min since she now has epidural which can contribute to dec ctx. She agrees with this POC. RN to monitor ctx frequency and will call me if pit indicated as noted above.  Anticipate SVD Re-evaluate progress in 4h or sooner prn.

## 2018-07-10 NOTE — Progress Notes (Signed)
Called by RN re: recurrent variable decels and decreased variability.  FHR cat II with recurrent variable decels. Baseline 120 and decel nadir 90-100. Return to baseline with ctx off-set and resolution.  Has been repositioned. Not on pitocin. Cricket has been feeling some increased vaginal pressure with ctx.  VE 9/100/0 - BBOW and this forebag SROM during exam. Large amount clear fluid returned Recommended IUPC and reviewed risks/benefits and she consented. This was placed without difficulty. Will do amnioinfusion for recurrent variables.  Moderate variability.  Normal labor progress.  Anticipate SVD Consult Dr Mora Appl if decels not resolved with amnioinfusion.

## 2018-07-11 LAB — CBC
HEMATOCRIT: 28.2 % — AB (ref 36.0–46.0)
Hemoglobin: 8.8 g/dL — ABNORMAL LOW (ref 12.0–15.0)
MCH: 26.2 pg (ref 26.0–34.0)
MCHC: 31.2 g/dL (ref 30.0–36.0)
MCV: 83.9 fL (ref 80.0–100.0)
Platelets: 237 10*3/uL (ref 150–400)
RBC: 3.36 MIL/uL — ABNORMAL LOW (ref 3.87–5.11)
RDW: 15.7 % — ABNORMAL HIGH (ref 11.5–15.5)
WBC: 9.9 10*3/uL (ref 4.0–10.5)
nRBC: 0.2 % (ref 0.0–0.2)

## 2018-07-11 NOTE — Lactation Note (Signed)
This note was copied from a baby's chart. Lactation Consultation Note  Patient Name: Lisa Summers [id number] Date: 07/11/2018 Reason for consult: Follow-up assessment;1st time breastfeeding;Early term 37-38.6wks  P1 mother whose infant is now 28 hours old.  This is an ETI at 38+5 weeks.  Baby was awake and alert when I entered the room.  Offered to assist with latching and mother accepted.    Mother's breasts are soft and non tender and nipples are everted but short shafted.  Assisted baby to latch in the football hold on the right breast without difficulty.  Baby had a wide open mouth and flanged lips and began sucking immediately.  Audible swallows noted.  Taught breast compressions and mother was able to return demonstrate effectively.  I observed him feeding for 12 minutes while reviewing basic breast feeding concepts to mother.  Demonstrated how to keep baby awake at the breast during feedings.  Mother denied pain with latching and stated, "it feels much better than before.  I feel tugging but no pain."    Encouraged to continue feeding 8-12 times/24 hours or sooner if baby shows feeding cues.  Mother will continue hand expression.  Grandmother present and a good support for mother.  Father also present.  Mother has a DEBP for home use and has it set up on her counter.  I encouraged her to keep it for home use and to just continue to put baby to the breast every time he shows feeding cues.  Reminded mother to feed STS.  Encouraged to call her RN/LC for latch assistance as needed.   Maternal Data Formula Feeding for Exclusion: No Has patient been taught Hand Expression?: Yes Does the patient have breastfeeding experience prior to this delivery?: No  Feeding Feeding Type: Breast Fed  LATCH Score Latch: Grasps breast easily, tongue down, lips flanged, rhythmical sucking.  Audible Swallowing: Spontaneous and intermittent  Type of Nipple: Everted at rest and after  stimulation  Comfort (Breast/Nipple): Soft / non-tender  Hold (Positioning): Assistance needed to correctly position infant at breast and maintain latch.  LATCH Score: 9  Interventions Interventions: Breast feeding basics reviewed;Assisted with latch;Skin to skin;Breast massage;Hand express;Breast compression;Position options;Support pillows;Adjust position  Lactation Tools Discussed/Used     Consult Status Consult Status: Follow-up Date: 07/12/18 Follow-up type: In-patient    Lisa Summers [name] 07/11/2018, 11:50 AM

## 2018-07-11 NOTE — Progress Notes (Signed)
Subjective: Postpartum Day # 1 : S/P NSVD due to spontaneous labor with vaginal delivery . Patient up ad lib, denies syncope or dizziness. Reports consuming regular diet without issues and denies N/V. Patient reports 0 bowel movement + passing flatus.  Denies issues with urination and reports bleeding is "lighter."  Patient is breastfeeding and reports going well.  Desires undecided for postpartum contraception.  Pain is being appropriately managed with use of po meds. Mild symptomatic anemia with c/o fatigue only, post delivery hgb was 8.8.   1st degree laceration Feeding:  Breast Contraceptive plan:  undecided BB: Circ for in pt circ today by Dr Normand Sloop.   Objective: Vital signs in last 24 hours: Patient Vitals for the past 24 hrs:  BP Temp Temp src Pulse Resp SpO2  07/11/18 0539 107/62 98.5 F (36.9 C) Oral 72 18 100 %  07/11/18 0200 117/65 99.2 F (37.3 C) - 92 18 100 %  07/10/18 1945 121/68 98 F (36.7 C) Oral 90 18 98 %  07/10/18 1545 118/68 98.4 F (36.9 C) Oral 83 20 98 %     Physical Exam:  General: alert, cooperative, appears stated age and no distress Mood/Affect: Happy Lungs: clear to auscultation, no wheezes, rales or rhonchi, symmetric air entry.  Heart: normal rate, regular rhythm, normal S1, S2, no murmurs, rubs, clicks or gallops. Breast: breasts appear normal, no suspicious masses, no skin or nipple changes or axillary nodes. Abdomen:  + bowel sounds, soft, non-tender GU: perineum approximate, healing well. No signs of external hematomas.  Uterine Fundus: firm Lochia: appropriate Skin: Warm, Dry. DVT Evaluation: No evidence of DVT seen on physical exam. Negative Homan's sign. No cords or calf tenderness. No significant calf/ankle edema.  CBC Latest Ref Rng & Units 07/11/2018 07/10/2018 02/18/2015  WBC 4.0 - 10.5 K/uL 9.9 7.0 5.7  Hemoglobin 12.0 - 15.0 g/dL 2.5(O) 0.3(B) 04.8  Hematocrit 36.0 - 46.0 % 28.2(L) 31.5(L) 35.8(L)  Platelets 150 - 400 K/uL 237 294 246     Results for orders placed or performed during the hospital encounter of 07/10/18 (from the past 24 hour(s))  CBC     Status: Abnormal   Collection Time: 07/11/18  5:26 AM  Result Value Ref Range   WBC 9.9 4.0 - 10.5 K/uL   RBC 3.36 (L) 3.87 - 5.11 MIL/uL   Hemoglobin 8.8 (L) 12.0 - 15.0 g/dL   HCT 88.9 (L) 16.9 - 45.0 %   MCV 83.9 80.0 - 100.0 fL   MCH 26.2 26.0 - 34.0 pg   MCHC 31.2 30.0 - 36.0 g/dL   RDW 38.8 (H) 82.8 - 00.3 %   Platelets 237 150 - 400 K/uL   nRBC 0.2 0.0 - 0.2 %     CBG (last 3)  No results for input(s): GLUCAP in the last 72 hours.   I/O last 3 completed shifts: In: -  Out: 2660 [Urine:2400; Blood:260]   Assessment Postpartum Day # 1 : S/P NSVD due to spontaneous labor with vaginal delivery. Pt stable. -2 involution. breastfeeding. Hemodynamically stable, mild symptomatic anemia, post delivery hgb was 8.8-from 9.7.   Plan: Continue other mgmt as ordered VTE prophylactics: Early ambulated as tolerates.  Pain control: Motrin/Tylenol PRN Education given regarding options for contraception, including barrier methods, injectable contraception, IUD placement, oral contraceptives.  Plan for discharge tomorrow, Breastfeeding, Lactation consult and Circumcision prior to discharge   Dr. Normand Sloop to be updated on patient status  [facility] NP-C, CNM 07/11/2018, 12:02 PM

## 2018-07-12 MED ORDER — MEDROXYPROGESTERONE ACETATE 150 MG/ML IM SUSP
150.0000 mg | Freq: Once | INTRAMUSCULAR | Status: AC
  Administered 2018-07-12: 150 mg via INTRAMUSCULAR
  Filled 2018-07-12: qty 1

## 2018-07-12 MED ORDER — IBUPROFEN 600 MG PO TABS: 600.0000 mg | ORAL_TABLET | Freq: Four times a day (QID) | ORAL | 0 refills | Status: AC | PRN

## 2018-07-12 MED ORDER — FERROUS SULFATE 325 (65 FE) MG PO TABS
325.0000 mg | ORAL_TABLET | Freq: Two times a day (BID) | ORAL | Status: DC
  Administered 2018-07-12: 325 mg via ORAL
  Filled 2018-07-12: qty 1

## 2018-07-12 MED ORDER — FERROUS SULFATE 325 (65 FE) MG PO TABS: 325.0000 mg | ORAL_TABLET | Freq: Every day | ORAL | 2 refills | Status: AC

## 2018-07-12 NOTE — Lactation Note (Signed)
This note was copied from a baby's chart. Lactation Consultation Note  Patient Name: Boy Saher Hickel [id number] Date: 07/12/2018 Reason for consult: Follow-up assessment Mom just finished a feeding using football hold.  She is feeling good about latch and feedings.  Discussed milk coming to volume and the prevention and treatment of engorgement.  Questions answered.  Mom and baby have follow up with LC at pediatricians office.  Lactation outpatient services and support reviewed and encouraged prn.  Maternal Data    Feeding Feeding Type: Breast Fed  LATCH Score Latch: Grasps breast easily, tongue down, lips flanged, rhythmical sucking.  Audible Swallowing: A few with stimulation  Type of Nipple: Everted at rest and after stimulation  Comfort (Breast/Nipple): Soft / non-tender  Hold (Positioning): Assistance needed to correctly position infant at breast and maintain latch.  LATCH Score: 8  Interventions    Lactation Tools Discussed/Used     Consult Status Consult Status: Complete Follow-up type: Call as needed    Myrtha Tonkovich S 07/12/2018, 9:30 AM

## 2018-07-12 NOTE — Discharge Instructions (Signed)
Postpartum Care After Vaginal Delivery ° °The period of time right after you deliver your newborn is called the postpartum period. °What kind of medical care will I receive? °· You may continue to receive fluids and medicines through an IV tube inserted into one of your veins. °· If an incision was made near your vagina (episiotomy) or if you had some vaginal tearing during delivery, cold compresses may be placed on your episiotomy or your tear. This helps to reduce pain and swelling. °· You may be given a squirt bottle to use when you go to the bathroom. You may use this until you are comfortable wiping as usual. To use the squirt bottle, follow these steps: °? Before you urinate, fill the squirt bottle with warm water. Do not use hot water. °? After you urinate, while you are sitting on the toilet, use the squirt bottle to rinse the area around your urethra and vaginal opening. This rinses away any urine and blood. °? You may do this instead of wiping. As you start healing, you may use the squirt bottle before wiping yourself. Make sure to wipe gently. °? Fill the squirt bottle with clean water every time you use the bathroom. °· You will be given sanitary pads to wear. °How can I expect to feel? °· You may not feel the need to urinate for several hours after delivery. °· You will have some soreness and pain in your abdomen and vagina. °· If you are breastfeeding, you may have uterine contractions every time you breastfeed for up to several weeks postpartum. Uterine contractions help your uterus return to its normal size. °· It is normal to have vaginal bleeding (lochia) after delivery. The amount and appearance of lochia is often similar to a menstrual period in the first week after delivery. It will gradually decrease over the next few weeks to a dry, yellow-brown discharge. For most women, lochia stops completely by 6-8 weeks after delivery. Vaginal bleeding can vary from woman to woman. °· Within the first few  days after delivery, you may have breast engorgement. This is when your breasts feel heavy, full, and uncomfortable. Your breasts may also throb and feel hard, tightly stretched, warm, and tender. After this occurs, you may have milk leaking from your breasts. Your health care provider can help you relieve discomfort due to breast engorgement. Breast engorgement should go away within a few days. °· You may feel more sad or worried than normal due to hormonal changes after delivery. These feelings should not last more than a few days. If these feelings do not go away after several days, speak with your health care provider. °How should I care for myself? °· Tell your health care provider if you have pain or discomfort. °· Drink enough water to keep your urine clear or pale yellow. °· Wash your hands thoroughly with soap and water for at least 20 seconds after changing your sanitary pads, after using the toilet, and before holding or feeding your baby. °· If you are not breastfeeding, avoid touching your breasts a lot. Doing this can make your breasts produce more milk. °· If you become weak or lightheaded, or you feel like you might faint, ask for help before: °? Getting out of bed. °? Showering. °· Change your sanitary pads frequently. Watch for any changes in your flow, such as a sudden increase in volume, a change in color, the passing of large blood clots. If you pass a blood clot from your vagina,   save it to show to your health care provider. Do not flush blood clots down the toilet without having your health care provider look at them. °· Make sure that all your vaccinations are up to date. This can help protect you and your baby from getting certain diseases. You may need to have immunizations done before you leave the hospital. °· If desired, talk with your health care provider about methods of family planning or birth control (contraception). °How can I start bonding with my baby? °Spending as much time as  possible with your baby is very important. During this time, you and your baby can get to know each other and develop a bond. Having your baby stay with you in your room (rooming in) can give you time to get to know your baby. Rooming in can also help you become comfortable caring for your baby. Breastfeeding can also help you bond with your baby. °How can I plan for returning home with my baby? °· Make sure that you have a car seat installed in your vehicle. °? Your car seat should be checked by a certified car seat installer to make sure that it is installed safely. °? Make sure that your baby fits into the car seat safely. °· Ask your health care provider any questions you have about caring for yourself or your baby. Make sure that you are able to contact your health care provider with any questions after leaving the hospital. °This information is not intended to replace advice given to you by your health care provider. Make sure you discuss any questions you have with your health care provider. °Document Released: 03/22/2007 Document Revised: 10/28/2015 Document Reviewed: 04/29/2015 °Elsevier Interactive Patient Education © 2018 [employer] ° ° °Postpartum Depression and Baby Blues °The postpartum period begins right after the birth of a baby. During this time, there is often a great amount of joy and excitement. It is also a time of many changes in the life of the parents. Regardless of how many times a mother gives birth, each child brings new challenges and dynamics to the family. It is not unusual to have feelings of excitement along with confusing shifts in moods, emotions, and thoughts. All mothers are at risk of developing postpartum depression or the "baby blues." These mood changes can occur right after giving birth, or they may occur many months after giving birth. The baby blues or postpartum depression can be mild or severe. Additionally, postpartum depression can go away rather quickly, or it can  be a long-term condition. °What are the causes? °Raised hormone levels and the rapid drop in those levels are thought to be a main cause of postpartum depression and the baby blues. A number of hormones change during and after pregnancy. Estrogen and progesterone usually decrease right after the delivery of your baby. The levels of thyroid hormone and various cortisol steroids also rapidly drop. Other factors that play a role in these mood changes include major life events and genetics. °What increases the risk? °If you have any of the following risks for the baby blues or postpartum depression, know what symptoms to watch out for during the postpartum period. Risk factors that may increase the likelihood of getting the baby blues or postpartum depression include: °· Having a personal or family history of depression. °· Having depression while being pregnant. °· Having premenstrual mood issues or mood issues related to oral contraceptives. °· Having a lot of life stress. °· Having marital conflict. °· Lacking   a social support network. °· Having a baby with special needs. °· Having health problems, such as diabetes. ° °What are the signs or symptoms? °Symptoms of baby blues include: °· Brief changes in mood, such as going from extreme happiness to sadness. °· Decreased concentration. °· Difficulty sleeping. °· Crying spells, tearfulness. °· Irritability. °· Anxiety. ° °Symptoms of postpartum depression typically begin within the first month after giving birth. These symptoms include: °· Difficulty sleeping or excessive sleepiness. °· Marked weight loss. °· Agitation. °· Feelings of worthlessness. °· Lack of interest in activity or food. ° °Postpartum psychosis is a very serious condition and can be dangerous. Fortunately, it is rare. Displaying any of the following symptoms is cause for immediate medical attention. Symptoms of postpartum psychosis include: °· Hallucinations and delusions. °· Bizarre or disorganized  behavior. °· Confusion or disorientation. ° °How is this diagnosed? °A diagnosis is made by an evaluation of your symptoms. There are no medical or lab tests that lead to a diagnosis, but there are various questionnaires that a health care provider may use to identify those with the baby blues, postpartum depression, or psychosis. Often, a screening tool called the Edinburgh Postnatal Depression Scale is used to diagnose depression in the postpartum period. °How is this treated? °The baby blues usually goes away on its own in 1-2 weeks. Social support is often all that is needed. You will be encouraged to get adequate sleep and rest. Occasionally, you may be given medicines to help you sleep. °Postpartum depression requires treatment because it can last several months or longer if it is not treated. Treatment may include individual or group therapy, medicine, or both to address any social, physiological, and psychological factors that may play a role in the depression. Regular exercise, a healthy diet, rest, and social support may also be strongly recommended. °Postpartum psychosis is more serious and needs treatment right away. Hospitalization is often needed. °Follow these instructions at home: °· Get as much rest as you can. Nap when the baby sleeps. °· Exercise regularly. Some women find yoga and walking to be beneficial. °· Eat a balanced and nourishing diet. °· Do little things that you enjoy. Have a cup of tea, take a bubble bath, read your favorite magazine, or listen to your favorite music. °· Avoid alcohol. °· Ask for help with household chores, cooking, grocery shopping, or running errands as needed. Do not try to do everything. °· Talk to people close to you about how you are feeling. Get support from your partner, family members, friends, or other new moms. °· Try to stay positive in how you think. Think about the things you are grateful for. °· Do not spend a lot of time alone. °· Only take  over-the-counter or prescription medicine as directed by your health care provider. °· Keep all your postpartum appointments. °· Let your health care provider know if you have any concerns. °Contact a health care provider if: °You are having a reaction to or problems with your medicine. °Get help right away if: °· You have suicidal feelings. °· You think you may harm the baby or someone else. °This information is not intended to replace advice given to you by your health care provider. Make sure you discuss any questions you have with your health care provider. °Document Released: 02/27/2004 Document Revised: 10/31/2015 Document Reviewed: 03/06/2013 °Elsevier Interactive Patient Education © 2017 [employer] ° ° °

## 2018-07-12 NOTE — Discharge Summary (Signed)
OB Discharge Summary     Patient Name: Lisa Summers DOB: [birth year] MRN: [id number]  Date of admission: 07/10/2018 Delivering MD: Janeece Riggers   Date of discharge: 07/12/2018  Admitting diagnosis: 38 wks water broke and come ctx Intrauterine pregnancy: [redacted]w[redacted]d     Secondary diagnosis:  Active Problems:   Vaginal delivery    Discharge diagnosis: Term Pregnancy Delivered and Anemia                                                                                                Post partum procedures:n/a  Augmentation: n/a  Complications: None  Hospital course:  Onset of Labor With Vaginal Delivery     28 y.o. yo G2P1011 at [redacted]w[redacted]d was admitted in Latent Labor on 07/10/2018. Patient had an uncomplicated labor course as follows:  Membrane Rupture Time/Date: 11:06 PM ,07/09/2018   Intrapartum Procedures: Episiotomy: None [1]                                         Lacerations:  1st degree [2]  Patient had a delivery of a Viable infant. 07/10/2018  Information for the patient's newborn:  Likisha, Soisson [[id number]]  Delivery Method: Vag-Spont    Pateint had an uncomplicated postpartum course.  She is ambulating, tolerating a regular diet, passing flatus, and urinating well. Patient is discharged home in stable condition on 07/12/18.   Physical exam  Vitals:   07/11/18 0539 07/11/18 1433 07/11/18 2229 07/12/18 0530  BP: 107/62 127/77 101/60 115/73  Pulse: 72 80  85  Resp: 18 20 16 18   Temp: 98.5 F (36.9 C) 98.8 F (37.1 C) 98.4 F (36.9 C) 98.5 F (36.9 C)  TempSrc: Oral Oral Oral Oral  SpO2: 100%     Weight:      Height:       General: alert, cooperative and no distress Lochia: appropriate Uterine Fundus: firm Incision: N/A DVT Evaluation: No evidence of DVT seen on physical exam. Negative Homan's sign. No cords or calf tenderness. No significant calf/ankle edema. Labs: Lab Results  Component Value Date   WBC 9.9 07/11/2018   HGB 8.8 (L) 07/11/2018   HCT 28.2 (L) 07/11/2018   MCV 83.9 07/11/2018   PLT 237 07/11/2018   CMP Latest Ref Rng & Units 02/18/2015  Glucose 65 - 99 mg/dL 93  BUN 6 - 20 mg/dL 7  Creatinine 1.74 - 9.44 mg/dL 9.67  Sodium 591 - 638 mmol/L 135  Potassium 3.5 - 5.1 mmol/L 3.9  Chloride 101 - 111 mmol/L 105  CO2 22 - 32 mmol/L 23  Calcium 8.9 - 10.3 mg/dL 8.9  Total Protein 6.5 - 8.1 g/dL 6.9  Total Bilirubin 0.3 - 1.2 mg/dL 0.3  Alkaline Phos 38 - 126 U/L 49  AST 15 - 41 U/L 14(L)  ALT 14 - 54 U/L 8(L)    Discharge instruction: per After Visit Summary and "Baby and Me Booklet".  After visit meds:  Allergies as of 07/12/2018   No  Known Allergies     Medication List    STOP taking these medications   aspirin EC 81 MG tablet     TAKE these medications   bisacodyl 5 MG EC tablet Commonly known as:  DULCOLAX Take 5 mg by mouth daily as needed for moderate constipation (Pt reports taking it as a suppository).   ferrous sulfate 325 (65 FE) MG tablet Take 1 tablet (325 mg total) by mouth daily with breakfast.   ibuprofen 600 MG tablet Commonly known as:  ADVIL,MOTRIN Take 1 tablet (600 mg total) by mouth every 6 (six) hours as needed for moderate pain.   pantoprazole 20 MG tablet Commonly known as:  PROTONIX Take 20 mg by mouth daily.   phenylephrine-shark liver oil-mineral oil-petrolatum 0.25-3-14-71.9 % rectal ointment Commonly known as:  PREPARATION H Place 1 application rectally 2 (two) times daily as needed for hemorrhoids.   prenatal multivitamin Tabs tablet Take 1 tablet by mouth daily at 12 noon.   ranitidine 150 MG tablet Commonly known as:  ZANTAC Take 150 mg by mouth 2 (two) times daily.       Diet: routine diet  Activity: Advance as tolerated. Pelvic rest for 6 weeks.   Outpatient follow up:6 weeks Follow up Appt:No future appointments. Follow up Visit:No follow-ups on file.  Postpartum contraception: Depo Provera  Newborn Data: Live born female  Birth Weight: 7 lb 4.1  oz (3290 g) APGAR: 9, 9  Newborn Delivery   Birth date/time:  07/10/2018 07:53:00 Delivery type:  Vaginal, Spontaneous     Baby Feeding: Breast Disposition:home with mother   07/12/2018 Janeece Riggers, CNM

## 2020-05-05 IMAGING — US US OB COMP LESS 14 WK
1 series · 15 of 22 positions shown · non-contrast
Comparison: 02/19/2015 pelvic ultrasound.

CLINICAL DATA: 26 y/o  F; vaginal bleeding.

EXAM:
OBSTETRIC <14 WK ULTRASOUND
TECHNIQUE: Transabdominal ultrasound was performed for evaluation of the
gestation as well as the maternal uterus and adnexal regions.

[Series 1: us ob comp less 14 wk · 15 of 22 slices shown]
[im 1/22]
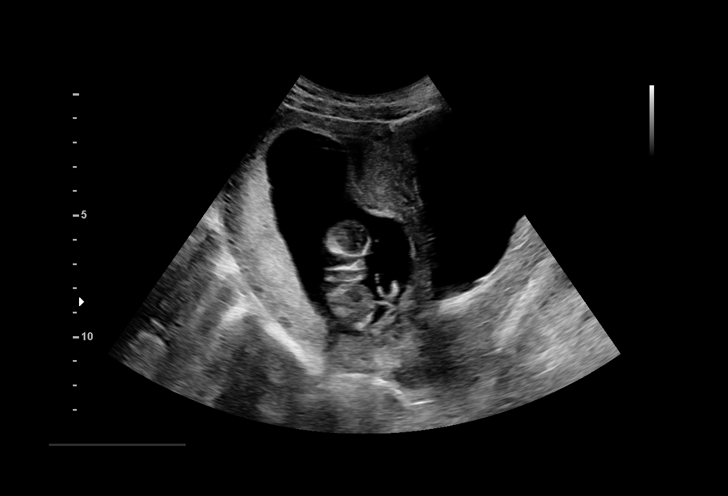
[im 3/22]
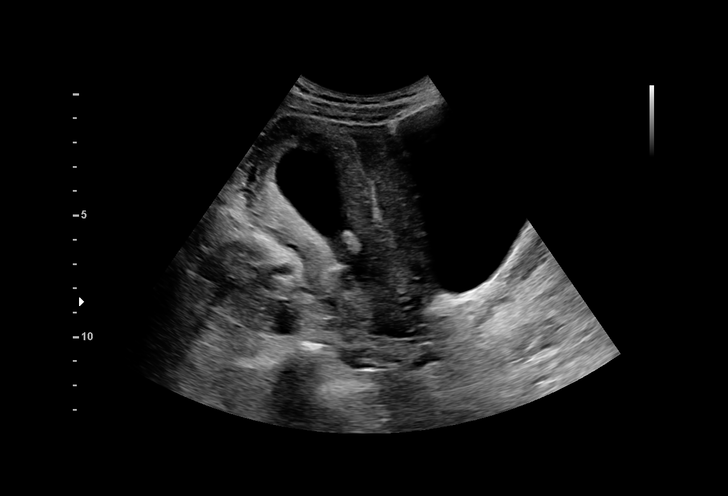
[im 4/22]
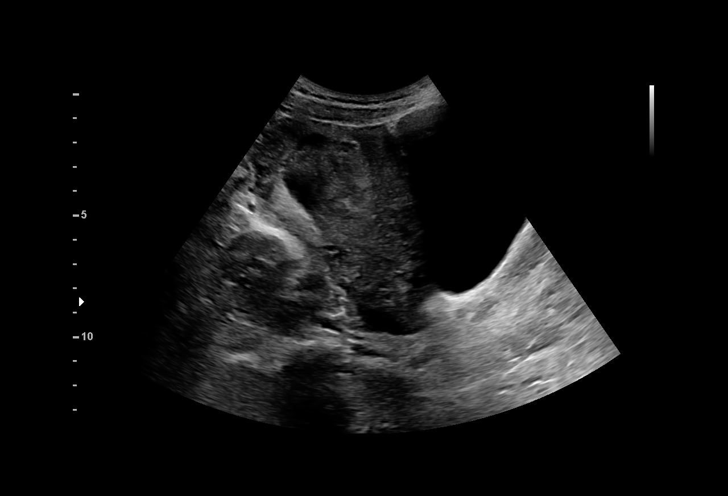
[im 6/22]
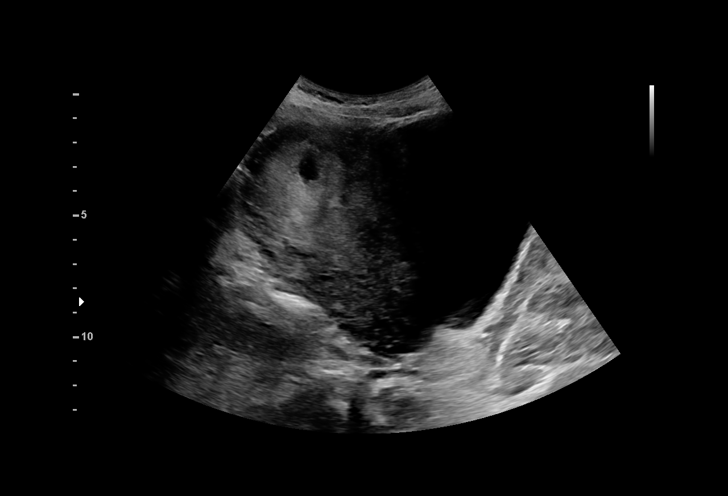
[im 7/22]
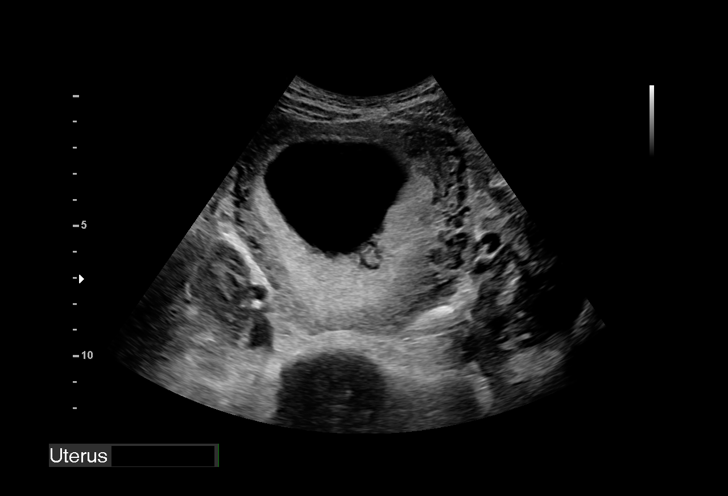
[im 9/22]
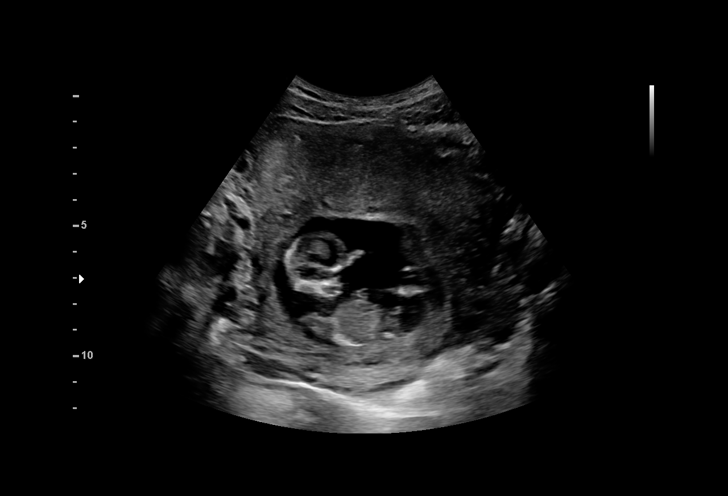
[im 10/22]
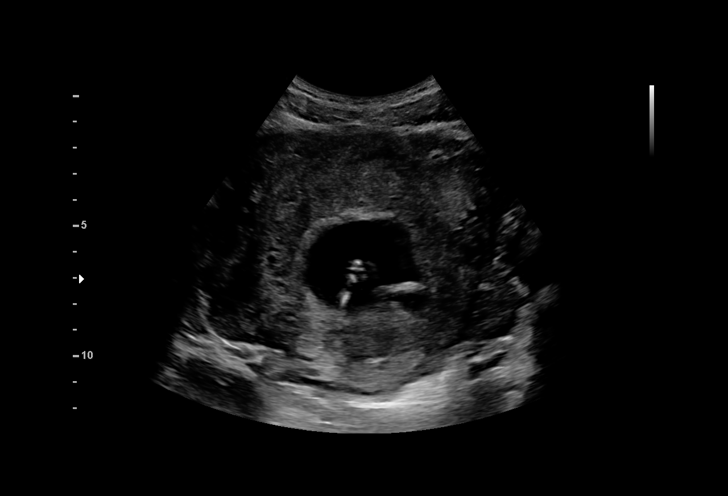
[im 12/22]
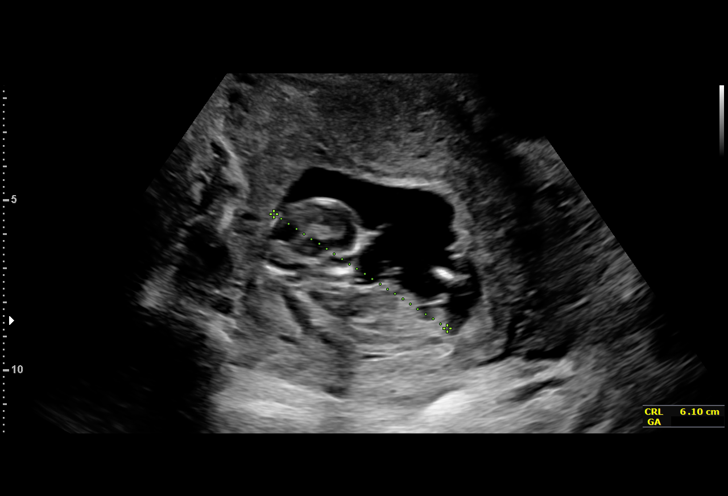
[im 13/22]
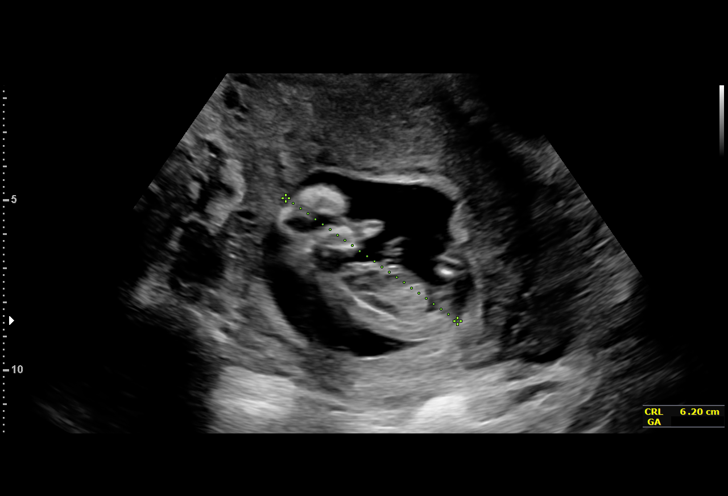
[im 14/22]
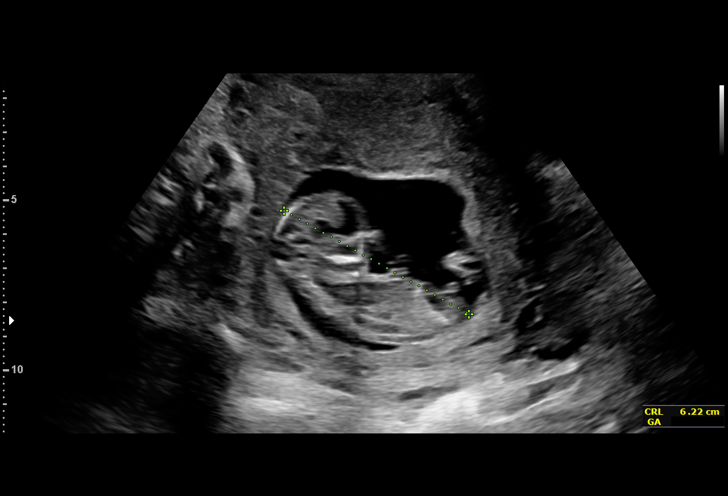
[im 16/22]
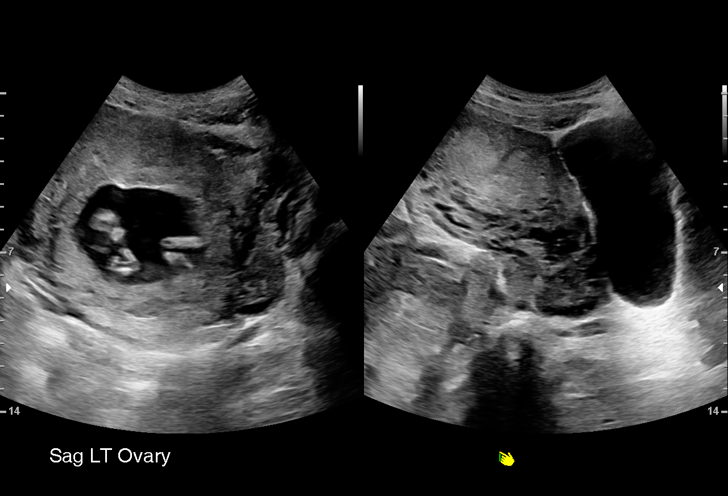
[im 17/22]
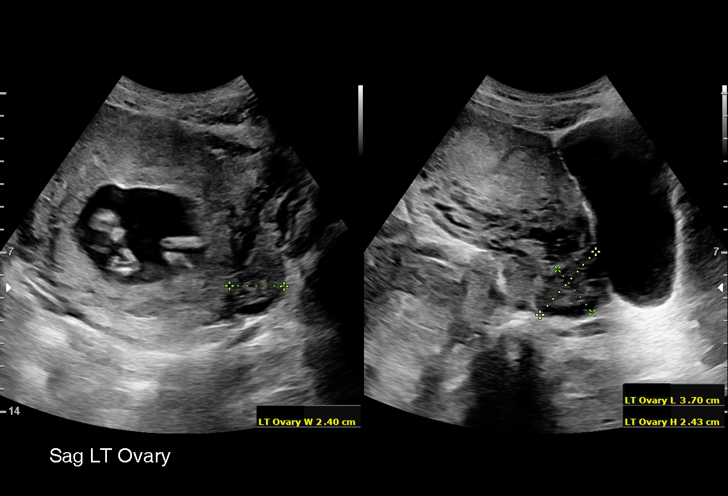
[im 19/22]
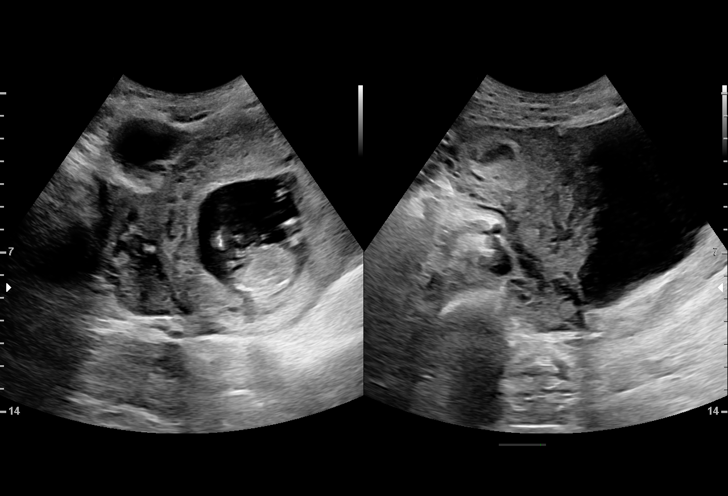
[im 20/22]
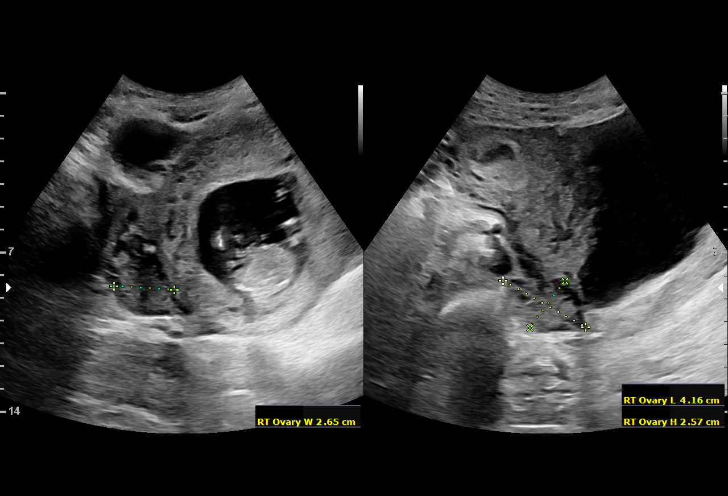
[im 22/22]
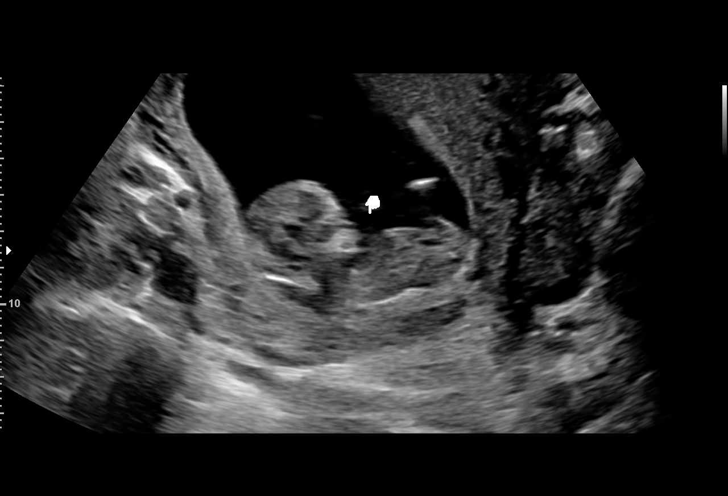

[15 of 22 positions shown; findings below may reference images not displayed]

FINDINGS: Intrauterine gestational sac: Single

Yolk sac:  Not Visualized.

Embryo:  Visualized.

Cardiac Activity: Visualized.

Heart Rate: 167 bpm

CRL: 61.8 mm   12 w 4 d                  US EDC: 07/20/2018

Subchorionic hemorrhage:  None visualized.

Maternal uterus/adnexae: Normal appearance of the uterus and
bilateral adnexa.
IMPRESSION: Single live intrauterine pregnancy with estimated gestational age of
12 weeks and 4 days. No acute process identified.

By: Taneka Kleinman M.D.
